# Patient Record
Sex: Female | Born: 1995 | Race: White | Hispanic: Yes | Marital: Single | Smoking: Never smoker
Health system: Southern US, Community
[De-identification: ages and names within clinical notes are randomized; demographics above are authoritative.]

## PROBLEM LIST (undated history)

## (undated) ENCOUNTER — Ambulatory Visit (HOSPITAL_COMMUNITY): Payer: Self-pay

## (undated) DIAGNOSIS — S82892A Other fracture of left lower leg, initial encounter for closed fracture: Secondary | ICD-10-CM

---
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
## 2017-10-19 ENCOUNTER — Encounter (HOSPITAL_COMMUNITY): Payer: Self-pay | Admitting: Emergency Medicine

## 2017-10-19 ENCOUNTER — Ambulatory Visit (HOSPITAL_COMMUNITY)
Admission: EM | Admit: 2017-10-19 | Discharge: 2017-10-19 | Disposition: A | Discharge status: Home / Self Care | Payer: Self-pay | Attending: Internal Medicine | Admitting: Internal Medicine

## 2017-10-19 ENCOUNTER — Other Ambulatory Visit: Payer: Self-pay

## 2017-10-19 DIAGNOSIS — Z8249 Family history of ischemic heart disease and other diseases of the circulatory system: Secondary | ICD-10-CM | POA: Insufficient documentation

## 2017-10-19 DIAGNOSIS — Z202 Contact with and (suspected) exposure to infections with a predominantly sexual mode of transmission: Secondary | ICD-10-CM

## 2017-10-19 DIAGNOSIS — Z791 Long term (current) use of non-steroidal anti-inflammatories (NSAID): Secondary | ICD-10-CM | POA: Insufficient documentation

## 2017-10-19 DIAGNOSIS — Z833 Family history of diabetes mellitus: Secondary | ICD-10-CM | POA: Insufficient documentation

## 2017-10-19 DIAGNOSIS — L989 Disorder of the skin and subcutaneous tissue, unspecified: Secondary | ICD-10-CM | POA: Insufficient documentation

## 2017-10-19 DIAGNOSIS — Z113 Encounter for screening for infections with a predominantly sexual mode of transmission: Secondary | ICD-10-CM

## 2017-10-19 DIAGNOSIS — Z9889 Other specified postprocedural states: Secondary | ICD-10-CM | POA: Insufficient documentation

## 2017-10-19 DIAGNOSIS — N9089 Other specified noninflammatory disorders of vulva and perineum: Secondary | ICD-10-CM

## 2017-10-19 DIAGNOSIS — Z801 Family history of malignant neoplasm of trachea, bronchus and lung: Secondary | ICD-10-CM | POA: Insufficient documentation

## 2017-10-19 DIAGNOSIS — N760 Acute vaginitis: Secondary | ICD-10-CM | POA: Insufficient documentation

## 2017-10-19 DIAGNOSIS — R300 Dysuria: Secondary | ICD-10-CM | POA: Insufficient documentation

## 2017-10-19 LAB — POCT URINALYSIS DIP (DEVICE)
GLUCOSE, UA: NEGATIVE mg/dL
KETONES UR: 40 mg/dL — AB
Nitrite: NEGATIVE
Protein, ur: 100 mg/dL — AB
Specific Gravity, Urine: 1.025 (ref 1.005–1.030)
Urobilinogen, UA: 0.2 mg/dL (ref 0.0–1.0)
pH: 6 (ref 5.0–8.0)

## 2017-10-19 LAB — POCT PREGNANCY, URINE: Preg Test, Ur: NEGATIVE

## 2017-10-19 MED ORDER — AZITHROMYCIN 250 MG PO TABS
ORAL_TABLET | ORAL | Status: AC
  Filled 2017-10-19: qty 4

## 2017-10-19 MED ORDER — METRONIDAZOLE 500 MG PO TABS: 500.0000 mg | ORAL_TABLET | Freq: Two times a day (BID) | ORAL | 0 refills | Status: AC

## 2017-10-19 MED ORDER — CEFTRIAXONE SODIUM 250 MG IJ SOLR
250.0000 mg | Freq: Once | INTRAMUSCULAR | Status: AC
  Administered 2017-10-19: 250 mg via INTRAMUSCULAR

## 2017-10-19 MED ORDER — VALACYCLOVIR HCL 1 G PO TABS: 1000.0000 mg | ORAL_TABLET | Freq: Two times a day (BID) | ORAL | 0 refills | Status: AC

## 2017-10-19 MED ORDER — AZITHROMYCIN 250 MG PO TABS
1000.0000 mg | ORAL_TABLET | Freq: Once | ORAL | Status: AC
  Administered 2017-10-19: 1000 mg via ORAL

## 2017-10-19 MED ORDER — CEFTRIAXONE SODIUM 250 MG IJ SOLR
INTRAMUSCULAR | Status: AC
  Filled 2017-10-19: qty 250

## 2017-10-19 NOTE — ED Provider Notes (Signed)
[facility]    CSN: [id number] Arrival date & time: 10/19/17  0957     History   Chief Complaint Chief Complaint  Patient presents with  . Urinary Tract Infection    HPI Laurie Underwood is a 22 y.o. female.   22 year old female presents with painful bumps/sores in the labia area for about 1-2 weeks. Causing pain when urine is passing over the sores. Also having unusual vaginal discharge with foul odor. May have had a slight fever the past 2 days and feels nauseous today. Denies any back or upper abdominal pain, vomiting or diarrhea. Has not taken any medications or tried any topical treatments for symptoms. Last period 2 days ago- was heavy but now has stopped. Is currently sexually active. Has had 6 partners in the past 3 months. Does not use condoms. No previous history of STD or cold sores. No family history of HSV. No other chronic health issues. Takes no daily medication.   The history is provided by the patient.    Past Medical History:  Diagnosis Date  . Ankle fracture, left 09/20/2015    There are no active problems to display for this patient.   Past Surgical History:  Procedure Laterality Date  . ORIF ANKLE FRACTURE Left 10/09/2015   Procedure: OPEN REDUCTION INTERNAL FIXATION (ORIF) LEFT ANKLE FRACTURE ;  Surgeon: Renette Butters, MD;  Location: [facility];  Service: Orthopedics;  Laterality: Left;    OB History   None      Home Medications    Prior to Admission medications   Medication Sig Start Date End Date Taking? Authorizing Provider  ibuprofen (ADVIL,MOTRIN) 600 MG tablet Take 1 tablet (600 mg total) by mouth every 6 (six) hours as needed. 11/15/16   Shary Decamp, PA-C  metroNIDAZOLE (FLAGYL) 500 MG tablet Take 1 tablet (500 mg total) by mouth 2 (two) times daily for 7 days. 10/19/17 10/26/17  Katy Apo, NP  valACYclovir (VALTREX) 1000 MG tablet Take 1 tablet (1,000 mg total) by mouth 2 (two) times daily for 10 days. 10/19/17  10/29/17  Katy Apo, NP    Family History Family History  Problem Relation Age of Onset  . Lung cancer Maternal Grandfather   . Hyperlipidemia Mother   . Diabetes Father   . Hypertension Maternal Grandmother     Social History Social History   Tobacco Use  . Smoking status: Never Smoker  . Smokeless tobacco: Never Used  Substance Use Topics  . Alcohol use: No  . Drug use: No     Allergies   Hydrocodone-acetaminophen and Soap   Review of Systems Review of Systems  Constitutional: Positive for appetite change, chills, fatigue and fever. Negative for activity change.  HENT: Negative for mouth sores, sore throat and trouble swallowing.   Respiratory: Negative for cough, chest tightness, shortness of breath and wheezing.   Gastrointestinal: Positive for abdominal pain and nausea. Negative for blood in stool, constipation, diarrhea and vomiting.  Genitourinary: Positive for decreased urine volume, dysuria, genital sores and vaginal discharge. Negative for difficulty urinating, flank pain, frequency, hematuria, pelvic pain and vaginal bleeding.  Musculoskeletal: Negative for arthralgias, back pain and myalgias.  Skin: Positive for color change and wound.  Allergic/Immunologic: Negative for immunocompromised state.  Neurological: Negative for dizziness, seizures, syncope, weakness, light-headedness and headaches.  Hematological: Negative for adenopathy. Does not bruise/bleed easily.  Psychiatric/Behavioral: Negative.      Physical Exam Triage Vital Signs ED Triage Vitals  Enc  Vitals Group     BP 10/19/17 1015 125/81     Pulse Rate 10/19/17 1015 83     Resp 10/19/17 1015 18     Temp 10/19/17 1015 98.1 F (36.7 C)     Temp Source 10/19/17 1015 Oral     SpO2 10/19/17 1015 100 %     Weight --      Height --      Head Circumference --      Peak Flow --      Pain Score 10/19/17 1016 9     Pain Loc --      Pain Edu? --      Excl. in [place]? --    No data  found.  Updated Vital Signs BP 125/81 (BP Location: Left Arm)   Pulse 83   Temp 98.1 F (36.7 C) (Oral)   Resp 18   LMP 10/14/2017   SpO2 100%   Visual Acuity Right Eye Distance:   Left Eye Distance:   Bilateral Distance:    Right Eye Near:   Left Eye Near:    Bilateral Near:     Physical Exam  Constitutional: She is oriented to person, place, and time. Vital signs are normal. She appears well-developed and well-nourished. No distress.  Patient is crying due to symptoms and pain.   HENT:  Head: Normocephalic and atraumatic.  Mouth/Throat: Uvula is midline, oropharynx is clear and moist and mucous membranes are normal.  Eyes: Conjunctivae and EOM are normal.  Neck: Normal range of motion.  Cardiovascular: Normal rate, regular rhythm and normal heart sounds.  No murmur heard. Pulmonary/Chest: Effort normal and breath sounds normal. No respiratory distress. She has no decreased breath sounds. She has no wheezes. She has no rhonchi. She has no rales.  Abdominal: Soft. Normal appearance and bowel sounds are normal. There is generalized tenderness. There is no rigidity, no rebound, no guarding and no CVA tenderness. Hernia confirmed negative in the right inguinal area and confirmed negative in the left inguinal area.  Genitourinary:    Pelvic exam was performed with patient in the knee-chest position. No labial fusion. There is tenderness and lesion on the right labia. There is no rash or injury on the right labia. There is tenderness and lesion on the left labia. There is no rash or injury on the left labia. There is tenderness in the vagina. No bleeding in the vagina. Vaginal discharge found.  Genitourinary Comments: Multiple red, raised lesions and ulcers present on mid to upper labia- more lesions on left than right. Over 15 lesions present. Inner labia and vaginal vault erythematous with more ulcers and slight yellowish, foul odor present. Very tender.  No internal exam performed  per patient request.   Musculoskeletal: Normal range of motion.  Lymphadenopathy: No inguinal adenopathy noted on the right or left side.  Neurological: She is alert and oriented to person, place, and time.  Skin: Skin is warm and dry. There is erythema.  Psychiatric: Her speech is normal and behavior is normal. Thought content normal. Her mood appears anxious.  Patient nervous and concerned about possible diagnosis.      UC Treatments / Results  Labs (all labs ordered are listed, but only abnormal results are displayed) Labs Reviewed  POCT URINALYSIS DIP (DEVICE) - Abnormal; Notable for the following components:      Result Value   Bilirubin Urine SMALL (*)    Ketones, ur 40 (*)    Hgb urine dipstick SMALL (*)  Protein, ur 100 (*)    Leukocytes, UA SMALL (*)    All other components within normal limits  HSV CULTURE AND TYPING  POCT PREGNANCY, URINE  URINE CYTOLOGY ANCILLARY ONLY    EKG None Radiology No results found.  Procedures Procedures (including critical care time)  Medications Ordered in UC Medications  azithromycin (ZITHROMAX) tablet 1,000 mg (1,000 mg Oral Given 10/19/17 1207)  cefTRIAXone (ROCEPHIN) injection 250 mg (250 mg Intramuscular Given 10/19/17 1207)     Initial Impression / Assessment and Plan / UC Course  I have reviewed the triage vital signs and the nursing notes.  Pertinent labs & imaging results that were available during my care of the patient were reviewed by me and considered in my medical decision making (see chart for details).    Patient unable to produce enough urine for culture. 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 Patient crying and had multiple questions regarding treatment and recurrence. Gave Rocephin 250mg  IM now and Zithromax 1g now for possible Chlamydia and Gonorrhea. Recommend start Valtrex 1g twice a day for 10 days. Start Flagyl 500mg  twice a day for 7 days for possible  BV. May use Vaseline on sores to help with comfort. May also take Ibuprofen 600 to 800mg  every 8 hours as needed for pain. No sexual intercourse for at least 7 days. Encouraged to use condoms with each and every future sexual encounter. Discussed course of HSV, recurrence and treatments. Recommend follow-up pending lab results.    Final Clinical Impressions(s) / UC Diagnoses   Final diagnoses:  Labial lesion  Acute vaginitis  Potential exposure to STD  Dysuria    ED Discharge Orders        Ordered    valACYclovir (VALTREX) 1000 MG tablet  2 times daily     10/19/17 1157    metroNIDAZOLE (FLAGYL) 500 MG tablet  2 times daily     10/19/17 1157       Controlled Substance Prescriptions [place] Controlled Substance Registry consulted? Not Applicable   Katy Apo, NP 10/19/17 2034

## 2017-10-19 NOTE — ED Triage Notes (Signed)
Complains of a 1-2 week history of bumps on labia.  Patient has pain with urination.

## 2017-10-19 NOTE — Discharge Instructions (Addendum)
You were given a shot of Rocephin (antibiotic) today and oral pills (Zithromax) to help with infection. Recommend start Valtrex 1 twice a day for 10 days. Start Flagyl 500mg  twice a day for 7 days for vaginal infection. May use Vaseline on sores to help with comfort. May take Ibuprofen 600 to 800mg  every 8 hours as needed for pain. No sexual intercourse for at least 7 days. Encouraged to use condoms with each and every future sexual encounter. Follow-up pending lab results.

## 2017-10-20 ENCOUNTER — Telehealth (HOSPITAL_COMMUNITY): Payer: Self-pay

## 2017-10-20 LAB — URINE CYTOLOGY ANCILLARY ONLY
Chlamydia: POSITIVE — AB
Neisseria Gonorrhea: NEGATIVE
Trichomonas: POSITIVE — AB

## 2017-10-20 NOTE — Telephone Encounter (Signed)
Chlamydia is positive.  This was treated at the urgent care visit with po zithromax 1g.  Attempted to contact pt. No answer at this time. Voicemail left.  Need to educate pt to please refrain from sexual intercourse for 7 days to give the medicine time to work.  Sexual partners need to be notified and tested/treated.  Condoms may reduce risk of reinfection.  Recheck or followup with PCP for further evaluation if symptoms are not improving.  [facility] notified. Also, Trichomonas is positive. Rx metronidazole was given at the urgent care visit. Attempted to contact pt. No answer at this time. Voicemail left.  Need to educate pt to please refrain from sexual intercourse for 7 days to give the medicine time to work. Sexual partners need to be notified and tested/treated. Condoms may reduce risk of reinfection. Recheck for further evaluation if symptoms are not improving. Answered all questions.

## 2017-10-21 LAB — URINE CYTOLOGY ANCILLARY ONLY: Candida vaginitis: NEGATIVE

## 2017-10-21 LAB — HSV CULTURE AND TYPING

## 2017-10-22 ENCOUNTER — Telehealth (HOSPITAL_COMMUNITY): Payer: Self-pay

## 2017-10-22 NOTE — Telephone Encounter (Signed)
Pt returned call regarding results. All education given to patient regarding results from previous note.  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 Educated on Herpes and safe sex practices. Pt denies any concerns and verbalized understanding. Also Bacterial vaginosis is positive. This was not treated at the urgent care visit.  Patient complains of persistent symptoms.  Flagyl 500 mg BID x 7 days #14 no refills sent to patients pharmacy of choice per Dr. Valere Dross.  Pt called and made aware of results and new prescription. Answered all questions and pt verbalized understanding. Pt concerned about price of medications, pt given information for GoodRX coupon.

## 2020-04-27 ENCOUNTER — Other Ambulatory Visit: Payer: Self-pay

## 2020-04-27 ENCOUNTER — Encounter (HOSPITAL_COMMUNITY): Payer: Self-pay | Admitting: Emergency Medicine

## 2020-04-27 ENCOUNTER — Emergency Department (HOSPITAL_COMMUNITY)
Admission: EM | Admit: 2020-04-27 | Discharge: 2020-04-27 | Disposition: A | Discharge status: Home / Self Care | Payer: Self-pay | Attending: Emergency Medicine | Admitting: Emergency Medicine

## 2020-04-27 DIAGNOSIS — L249 Irritant contact dermatitis, unspecified cause: Secondary | ICD-10-CM | POA: Insufficient documentation

## 2020-04-27 NOTE — ED Triage Notes (Signed)
Pt reports rash to left hand a month. States she popped a spot on her hand and now states random spots have popped up her arms. Also endorses a mouth sore.

## 2020-04-27 NOTE — Discharge Instructions (Signed)
You can try taking zyrtec if there is itching.  Also use a really good lotion like Aveeno or Eucerin.  Syphilis results will come back in 1 or 2 days

## 2020-04-27 NOTE — ED Provider Notes (Signed)
[facility] EMERGENCY DEPARTMENT Provider Note   CSN: [id number] Arrival date & time: 04/27/20  1542     History Chief Complaint  Patient presents with  . Rash    Laurie Underwood is a 24 y.o. female.  The history is provided by the patient.  Rash Location: left hand and then intermittently on the arms. Quality comment:  Small red bumps that sometimes itch Severity:  Mild Onset quality:  Gradual Duration:  1 month Timing:  Intermittent Progression:  Partially resolved Chronicity:  New Context comment:  Unknown Relieved by:  None tried Worsened by:  Nothing Ineffective treatments:  None tried Associated symptoms: no joint pain and no myalgias   Associated symptoms comment:  3 days ago also got a sore on the inner lower lip      Past Medical History:  Diagnosis Date  . Ankle fracture, left 09/20/2015    There are no problems to display for this patient.   Past Surgical History:  Procedure Laterality Date  . ORIF ANKLE FRACTURE Left 10/09/2015   Procedure: OPEN REDUCTION INTERNAL FIXATION (ORIF) LEFT ANKLE FRACTURE ;  Surgeon: Renette Butters, MD;  Location: [facility];  Service: Orthopedics;  Laterality: Left;     OB History   No obstetric history on file.     Family History  Problem Relation Age of Onset  . Lung cancer Maternal Grandfather   . Hyperlipidemia Mother   . Diabetes Father   . Hypertension Maternal Grandmother     Social History   Tobacco Use  . Smoking status: Never Smoker  . Smokeless tobacco: Never Used  Substance Use Topics  . Alcohol use: No  . Drug use: No    Home Medications Prior to Admission medications   Medication Sig Start Date End Date Taking? Authorizing Provider  ibuprofen (ADVIL,MOTRIN) 600 MG tablet Take 1 tablet (600 mg total) by mouth every 6 (six) hours as needed. 11/15/16   Shary Decamp, PA-C    Allergies    Hydrocodone-acetaminophen and Soap  Review of Systems   Review of  Systems  Musculoskeletal: Negative for arthralgias and myalgias.  Skin: Positive for rash.  All other systems reviewed and are negative.   Physical Exam Updated Vital Signs BP 102/73   Pulse 71   Temp 98.6 F (37 C) (Oral)   Resp 16   Ht 5\' 3"  (1.6 m)   Wt 59.9 kg   SpO2 100%   BMI 23.38 kg/m   Physical Exam Vitals and nursing note reviewed.  Constitutional:      General: She is not in acute distress.    Appearance: Normal appearance. She is normal weight.  HENT:     Head: Normocephalic.     Nose: Nose normal.     Mouth/Throat:     Mouth: Mucous membranes are moist.     Comments: Ulcerative circular lesion present on the inner lower lip Cardiovascular:     Rate and Rhythm: Normal rate.     Pulses: Normal pulses.  Pulmonary:     Effort: Pulmonary effort is normal.  Musculoskeletal:        General: No swelling.  Skin:    General: Skin is warm.     Comments: No notable rash at this time.  Dry skin to the hands without flaking  Neurological:     Mental Status: She is alert. Mental status is at baseline.  Psychiatric:        Mood and Affect: Mood normal.  Behavior: Behavior normal.        Thought Content: Thought content normal.     ED Results / Procedures / Treatments   Labs (all labs ordered are listed, but only abnormal results are displayed) Labs Reviewed  RPR    EKG None  Radiology No results found.  Procedures Procedures (including critical care time)  Medications Ordered in ED Medications - No data to display  ED Course  I have reviewed the triage vital signs and the nursing notes.  Pertinent labs & imaging results that were available during my care of the patient were reviewed by me and considered in my medical decision making (see chart for details).    MDM Rules/Calculators/A&P                          Patient presenting reporting that she has had an intermittent rash for the last 1 month.  Started on her hand but has also been  on her arms intermittently.  She did speak with the doctor on a televisit who told her she may have molluscum.  However on my exam I cannot appreciate any specific rash.  She does not have molluscum.  She does have dry skin.  Feel that the rash patient is discussing is most likely from dry skin and localized irritant.  She has no rash on her palms or soles concerning for syphilis however patient wants to be tested because she is concerned.  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  She does have an aphthous ulcer in her mouth but no evidence of lesions around the mouth.  Patient was tested for HIV last week and was negative.  Encouraged her to use a thick moisturizing lotion and Zyrtec as needed if she is getting more itchiness.  No evidence of allergic reaction at this time.  Final Clinical Impression(s) / ED Diagnoses Final diagnoses:  Irritant contact dermatitis, unspecified trigger    Rx / DC Orders ED Discharge Orders    None       Blanchie Dessert, MD 04/27/20 1827

## 2020-04-27 NOTE — ED Notes (Signed)
Patient Alert and oriented to baseline. Stable and ambulatory to baseline. Patient verbalized understanding of the discharge instructions.  Patient belongings were taken by the patient.   

## 2020-04-28 LAB — RPR: RPR Ser Ql: NONREACTIVE

## 2020-05-03 ENCOUNTER — Other Ambulatory Visit: Payer: Self-pay

## 2020-05-03 ENCOUNTER — Encounter (HOSPITAL_COMMUNITY): Payer: Self-pay

## 2020-05-03 ENCOUNTER — Ambulatory Visit (HOSPITAL_COMMUNITY)
Admission: EM | Admit: 2020-05-03 | Discharge: 2020-05-03 | Disposition: A | Discharge status: Home / Self Care | Payer: Self-pay

## 2020-05-03 DIAGNOSIS — R21 Rash and other nonspecific skin eruption: Secondary | ICD-10-CM

## 2020-05-03 DIAGNOSIS — Z3202 Encounter for pregnancy test, result negative: Secondary | ICD-10-CM

## 2020-05-03 DIAGNOSIS — R109 Unspecified abdominal pain: Secondary | ICD-10-CM

## 2020-05-03 LAB — POCT URINALYSIS DIPSTICK, ED / UC
Bilirubin Urine: NEGATIVE
Glucose, UA: NEGATIVE mg/dL
Hgb urine dipstick: NEGATIVE
Ketones, ur: NEGATIVE mg/dL
Leukocytes,Ua: NEGATIVE
Nitrite: NEGATIVE
Protein, ur: NEGATIVE mg/dL
Specific Gravity, Urine: 1.01 (ref 1.005–1.030)
Urobilinogen, UA: 0.2 mg/dL (ref 0.0–1.0)
pH: 5.5 (ref 5.0–8.0)

## 2020-05-03 LAB — POC URINE PREG, ED: Preg Test, Ur: NEGATIVE

## 2020-05-03 NOTE — ED Triage Notes (Addendum)
Pt is here after feeling weak with back and abdominal pain this started this morning. Pt also has a rash that started 5 weeks ago & was seen for this & was told it was Dermatitis. 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 Pt is requesting a specific medicine and further testing like a biopsy  for her rash.

## 2020-05-03 NOTE — Discharge Instructions (Signed)
Follow up with dermatology as planned Your urine was normal Drink plenty of water.

## 2020-05-04 NOTE — ED Provider Notes (Signed)
[facility]    CSN: [id number] Arrival date & time: 05/03/20  1155      History   Chief Complaint Chief Complaint  Patient presents with  . Abdominal Pain  . Rash    HPI Kayle Passarelli is a 24 y.o. female.   Patient is a 24 year old female presents today for rash.  This is been ongoing issue off and on for the past 5 weeks.  Has been seen for this and diagnosed with dermatitis.  The rash is itchy.  Rash is scattered.  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  Here today requesting biopsy of rash and specific medication to treat.  Reports she has been online with a chat about HSV and believes this rash is related to that. She has been applying tape to the rash.   Denies any fever, joint pain. Denies any recent changes in lotions, detergents, foods or other possible irritants. No recent travel. Nobody else at home has the rash. Patient has been outside but denies any contact with plants or insects. No new foods or medications.   She is also having some lower back pain, generalized lower abdominal pain.  No nausea, vomiting or diarrhea.  No specific urinary symptoms or vaginal symptoms.     Past Medical History:  Diagnosis Date  . Ankle fracture, left 09/20/2015    There are no problems to display for this patient.   Past Surgical History:  Procedure Laterality Date  . ORIF ANKLE FRACTURE Left 10/09/2015   Procedure: OPEN REDUCTION INTERNAL FIXATION (ORIF) LEFT ANKLE FRACTURE ;  Surgeon: Renette Butters, MD;  Location: [facility];  Service: Orthopedics;  Laterality: Left;    OB History   No obstetric history on file.      Home Medications    Prior to Admission medications   Medication Sig Start Date End Date Taking? Authorizing Provider  valGANciclovir (VALCYTE) 450 MG tablet Take by mouth daily.   Yes [provider]  ibuprofen (ADVIL,MOTRIN) 600 MG tablet Take 1 tablet (600 mg total) by mouth every 6  (six) hours as needed. 11/15/16   Shary Decamp, PA-C    Family History Family History  Problem Relation Age of Onset  . Lung cancer Maternal Grandfather   . Hyperlipidemia Mother   . Diabetes Father   . Hypertension Maternal Grandmother     Social History Social History   Tobacco Use  . Smoking status: Never Smoker  . Smokeless tobacco: Never Used  Substance Use Topics  . Alcohol use: No  . Drug use: No     Allergies   Hydrocodone-acetaminophen and Soap   Review of Systems Review of Systems   Physical Exam Triage Vital Signs ED Triage Vitals  Enc Vitals Group     BP 05/03/20 1326 98/62     Pulse Rate 05/03/20 1326 73     Resp 05/03/20 1326 16     Temp 05/03/20 1326 97.8 F (36.6 C)     Temp Source 05/03/20 1326 Oral     SpO2 05/03/20 1326 100 %     Weight --      Height --      Head Circumference --      Peak Flow --      Pain Score 05/03/20 1322 0     Pain Loc --      Pain Edu? --      Excl. in [place]? --  No data found.  Updated Vital Signs BP 98/62 (BP Location: Right Arm)   Pulse 73   Temp 97.8 F (36.6 C) (Oral)   Resp 16   LMP 04/23/2020   SpO2 100%   Visual Acuity Right Eye Distance:   Left Eye Distance:   Bilateral Distance:    Right Eye Near:   Left Eye Near:    Bilateral Near:     Physical Exam Vitals and nursing note reviewed.  Constitutional:      General: She is not in acute distress.    Appearance: Normal appearance. She is not ill-appearing, toxic-appearing or diaphoretic.  HENT:     Head: Normocephalic.     Nose: Nose normal.     Mouth/Throat:     Pharynx: Oropharynx is clear.  Eyes:     Conjunctiva/sclera: Conjunctivae normal.  Pulmonary:     Effort: Pulmonary effort is normal.  Abdominal:     Palpations: Abdomen is soft.     Tenderness: There is no abdominal tenderness.  Musculoskeletal:        General: Normal range of motion.     Cervical back: Normal range of motion.  Skin:    General: Skin is warm and dry.       Findings: No rash.     Comments: No visible rash   Neurological:     Mental Status: She is alert.  Psychiatric:        Mood and Affect: Mood normal.      UC Treatments / Results  Labs (all labs ordered are listed, but only abnormal results are displayed) Labs Reviewed  POC URINE PREG, ED  POCT URINALYSIS DIPSTICK, ED / UC    EKG   Radiology No results found.  Procedures Procedures (including critical care time)  Medications Ordered in UC Medications - No data to display  Initial Impression / Assessment and Plan / UC Course  I have reviewed the triage vital signs and the nursing notes.  Pertinent labs & imaging results that were available during my care of the patient were reviewed by me and considered in my medical decision making (see chart for details).     Rash No visible rash on exam Patient requesting specific medication that she googled online to treat this rash. Recommended that is not best practice.   She has an appointment next week to see a dermatologist.  Recommend she follow-up with them.  Abdominal pain No specific pain on exam. Urine without infection or pregnancy Follow up as needed for continued or worsening symptoms  Final Clinical Impressions(s) / UC Diagnoses   Final diagnoses:  Rash  Abdominal pain, unspecified abdominal location     Discharge Instructions     Follow up with dermatology as planned Your urine was normal Drink plenty of water.      ED Prescriptions    None     PDMP not reviewed this encounter.   Orvan July, NP 05/04/20 [phone]

## 2021-08-14 ENCOUNTER — Encounter (HOSPITAL_COMMUNITY): Payer: Self-pay | Admitting: *Deleted

## 2021-08-14 ENCOUNTER — Emergency Department (HOSPITAL_COMMUNITY)
Admission: EM | Admit: 2021-08-14 | Discharge: 2021-08-15 | Disposition: A | Discharge status: Home / Self Care | Payer: BC Managed Care – PPO | Attending: Emergency Medicine | Admitting: Emergency Medicine

## 2021-08-14 ENCOUNTER — Inpatient Hospital Stay (HOSPITAL_COMMUNITY): Admission: RE | Admit: 2021-08-14 | Payer: Self-pay | Source: Ambulatory Visit

## 2021-08-14 ENCOUNTER — Other Ambulatory Visit: Payer: Self-pay

## 2021-08-14 DIAGNOSIS — R21 Rash and other nonspecific skin eruption: Secondary | ICD-10-CM | POA: Diagnosis present

## 2021-08-14 DIAGNOSIS — Z5321 Procedure and treatment not carried out due to patient leaving prior to being seen by health care provider: Secondary | ICD-10-CM | POA: Insufficient documentation

## 2021-08-14 DIAGNOSIS — N899 Noninflammatory disorder of vagina, unspecified: Secondary | ICD-10-CM | POA: Diagnosis not present

## 2021-08-14 DIAGNOSIS — K120 Recurrent oral aphthae: Secondary | ICD-10-CM | POA: Diagnosis not present

## 2021-08-14 NOTE — ED Triage Notes (Addendum)
Pt states she has had a rash "on and off for a half of a year" on her lower legs. Also has two sores in her mouth she would like evaluated. White vaginal discharge for about 2 weeks.

## 2021-08-14 NOTE — ED Provider Triage Note (Signed)
Emergency Medicine Provider Triage Evaluation Note  Laurie Underwood , a 26 y.o. female  was evaluated in triage.  Pt complains of rash to bilateral legs, vaginal discharge and unprotected sex on December 26.  Patient is here requesting STI testing.  She also states she has a vaginal discharge, is requesting BV and trichomonas testing.  Patient states that she has a history of HSV-2, recently had sex with partner unprotected he also has unknown STI.[name]  Review of Systems  Positive: Vaginal discharge, abdominal pain Negative: Nausea, vomiting, fevers, diarrhea  Physical Exam  BP 135/86 (BP Location: Right Arm)    Pulse 68    Temp 98.7 F (37.1 C) (Oral)    Resp 19    LMP 07/26/2021    SpO2 100%  Gen:   Awake, no distress   Resp:  Normal effort  MSK:   Moves extremities without difficulty  Other:    Medical Decision Making  Medically screening exam initiated at 7:57 PM.  Appropriate orders placed.  Sheretha Shadd was informed that the remainder of the evaluation will be completed by another provider, this initial triage assessment does not replace that evaluation, and the importance of remaining in the ED until their evaluation is complete.     Azucena Cecil, PA-C 08/14/21 1958

## 2021-08-15 LAB — GC/CHLAMYDIA PROBE AMP (~~LOC~~) NOT AT ARMC
Chlamydia: NEGATIVE
Comment: NEGATIVE
Comment: NORMAL
Neisseria Gonorrhea: NEGATIVE

## 2021-08-15 NOTE — ED Notes (Signed)
Pt didn't answered when called for vitals

## 2021-10-07 ENCOUNTER — Emergency Department (HOSPITAL_COMMUNITY)
Admission: EM | Admit: 2021-10-07 | Discharge: 2021-10-08 | Discharge status: Against Medical Advice | Payer: 59 | Attending: Student | Admitting: Student

## 2021-10-07 ENCOUNTER — Encounter (HOSPITAL_COMMUNITY): Payer: Self-pay

## 2021-10-07 ENCOUNTER — Emergency Department (HOSPITAL_COMMUNITY): Payer: 59

## 2021-10-07 ENCOUNTER — Other Ambulatory Visit: Payer: Self-pay

## 2021-10-07 DIAGNOSIS — N83201 Unspecified ovarian cyst, right side: Secondary | ICD-10-CM | POA: Insufficient documentation

## 2021-10-07 DIAGNOSIS — N939 Abnormal uterine and vaginal bleeding, unspecified: Secondary | ICD-10-CM | POA: Insufficient documentation

## 2021-10-07 DIAGNOSIS — D259 Leiomyoma of uterus, unspecified: Secondary | ICD-10-CM | POA: Diagnosis not present

## 2021-10-07 DIAGNOSIS — Z5321 Procedure and treatment not carried out due to patient leaving prior to being seen by health care provider: Secondary | ICD-10-CM | POA: Insufficient documentation

## 2021-10-07 LAB — COMPREHENSIVE METABOLIC PANEL
ALT: 10 U/L (ref 0–44)
AST: 12 U/L — ABNORMAL LOW (ref 15–41)
Albumin: 3.7 g/dL (ref 3.5–5.0)
Alkaline Phosphatase: 47 U/L (ref 38–126)
Anion gap: 7 (ref 5–15)
BUN: 10 mg/dL (ref 6–20)
CO2: 23 mmol/L (ref 22–32)
Calcium: 8.9 mg/dL (ref 8.9–10.3)
Chloride: 107 mmol/L (ref 98–111)
Creatinine, Ser: 0.52 mg/dL (ref 0.44–1.00)
GFR, Estimated: >60 mL/min (ref >60)
Glucose, Bld: 85 mg/dL (ref 70–99)
Potassium: 4.2 mmol/L (ref 3.5–5.1)
Sodium: 137 mmol/L (ref 135–145)
Total Bilirubin: 0.4 mg/dL (ref 0.3–1.2)
Total Protein: 6.7 g/dL (ref 6.5–8.1)

## 2021-10-07 LAB — ABO/RH: ABO/RH(D): A POS

## 2021-10-07 LAB — CBC
HCT: 36.5 % (ref 36.0–46.0)
Hemoglobin: 11.4 g/dL — ABNORMAL LOW (ref 12.0–15.0)
MCH: 28.4 pg (ref 26.0–34.0)
MCHC: 31.2 g/dL (ref 30.0–36.0)
MCV: 91 fL (ref 80.0–100.0)
Platelets: 379 10*3/uL (ref 150–400)
RBC: 4.01 MIL/uL (ref 3.87–5.11)
RDW: 12.1 % (ref 11.5–15.5)
WBC: 10.4 10*3/uL (ref 4.0–10.5)
nRBC: 0 % (ref 0.0–0.2)

## 2021-10-07 LAB — I-STAT BETA HCG BLOOD, ED (MC, WL, AP ONLY): I-stat hCG, quantitative: <5 m[IU]/mL (ref <5)

## 2021-10-07 LAB — TYPE AND SCREEN
ABO/RH(D): A POS
Antibody Screen: NEGATIVE

## 2021-10-07 NOTE — ED Triage Notes (Signed)
Pt reports vaginal bleeding since Saturday. Pt reports she started having small amount of clots.  ?

## 2021-10-07 NOTE — ED Provider Triage Note (Signed)
Emergency Medicine Provider Triage Evaluation Note ? ?Laurie Underwood , a 26 y.o. female  was evaluated in triage.  Pt complains of vaginal bleeding for the past three days. The patient reports she is unsure if it is from her shaving of if it is from her vagina. LMP was 09/22/21. Denies any abdominal pain, nausea, or vomiting. She reports fatigue. She is using 3 super pads daily, not fully saturated. . ? ?Review of Systems  ?Positive: See above ?Negative: See above ? ?Physical Exam  ?BP 121/80 (BP Location: Right Arm)   Pulse 80   Temp 98.3 ?F (36.8 ?C) (Oral)   Resp 18   SpO2 97%  ?Gen:   Awake, no distress   ?Resp:  Normal effort  ?MSK:   Moves extremities without difficulty  ?Other:   ? ?Medical Decision Making  ?Medically screening exam initiated at 9:26 PM.  Appropriate orders placed.  Laurie Underwood was informed that the remainder of the evaluation will be completed by another provider, this initial triage assessment does not replace that evaluation, and the importance of remaining in the ED until their evaluation is complete. ? ?Will order labs and ultrasound ?  ?Sherrell Puller, PA-C ?10/07/21 2129 ? ?

## 2021-10-08 NOTE — ED Notes (Signed)
Patient left on own accord °

## 2022-08-02 IMAGING — US US PELVIS COMPLETE
1 series · 13 of 25 positions shown · non-contrast
Comparison: None

CLINICAL DATA: Vaginal bleeding

EXAM:
TRANSABDOMINAL AND TRANSVAGINAL ULTRASOUND OF PELVIS
TECHNIQUE: Both transabdominal and transvaginal ultrasound examinations of the
pelvis were performed. Transabdominal technique was performed for
global imaging of the pelvis including uterus, ovaries, adnexal
regions, and pelvic cul-de-sac. It was necessary to proceed with
endovaginal exam following the transabdominal exam to visualize the
ovaries and endometrium.

[Series 1: us pelvis (transabdominal only) · 13 of 141 slices shown]
[im 1/141]
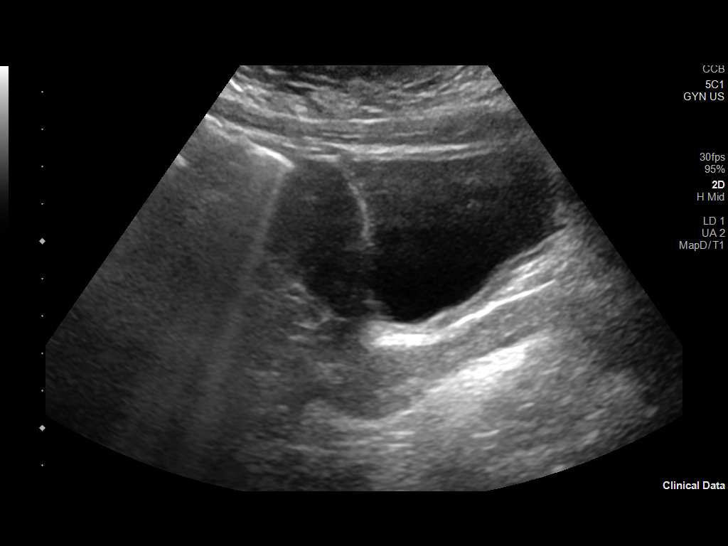
[im 12/141]
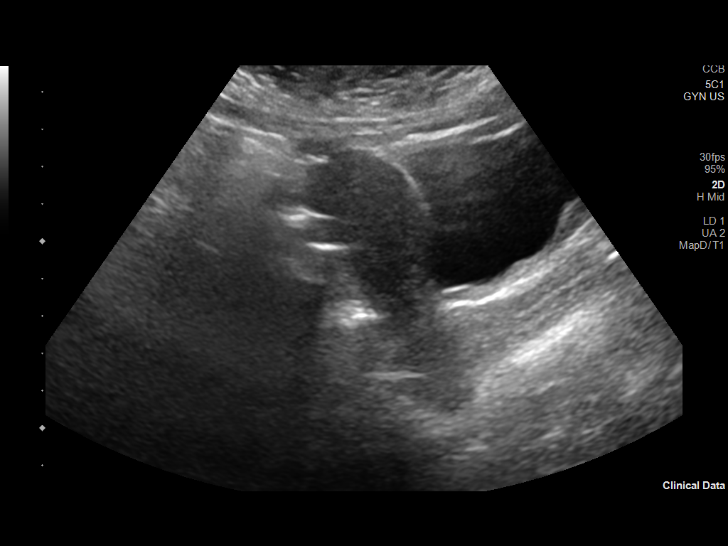
[im 24/141]
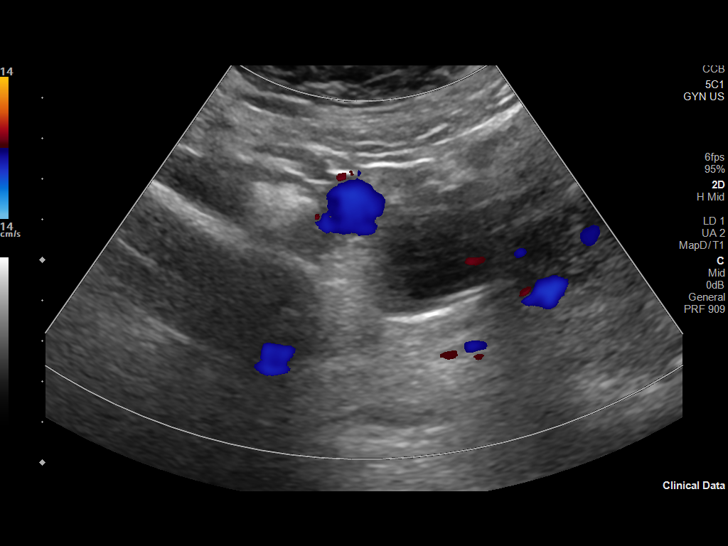
[im 36/141]
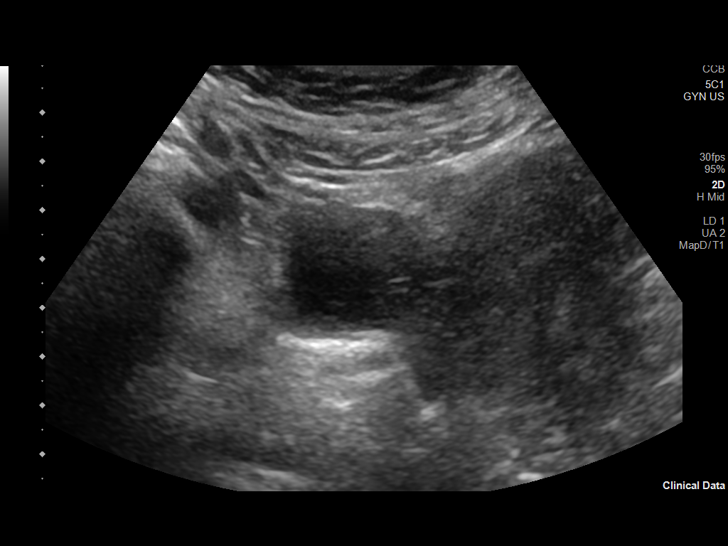
[im 47/141]
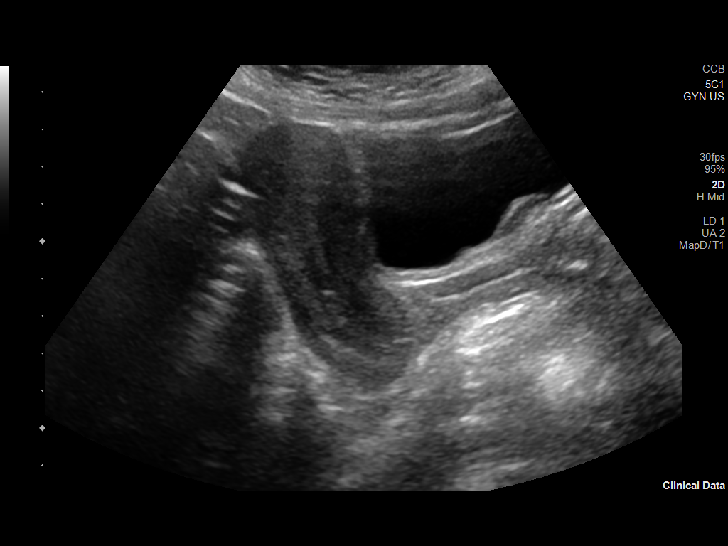
[im 59/141]
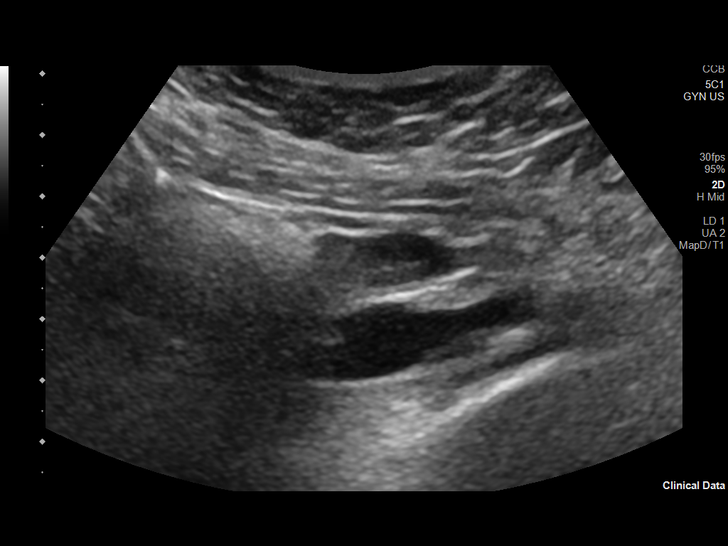
[im 71/141]
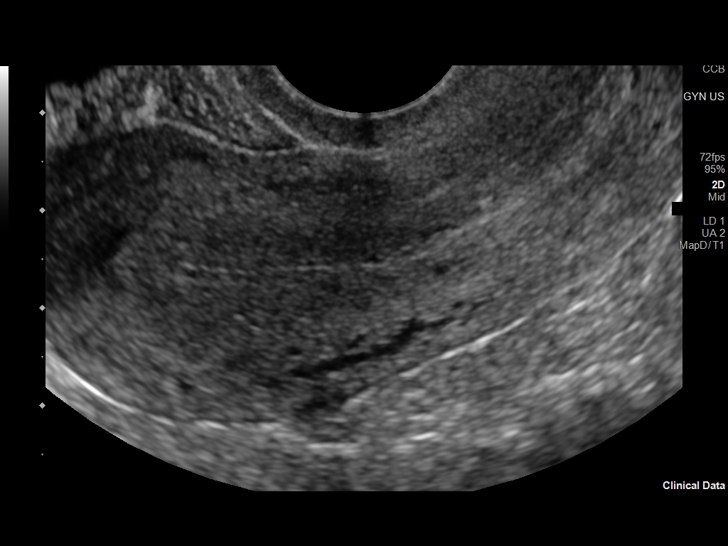
[im 82/141]
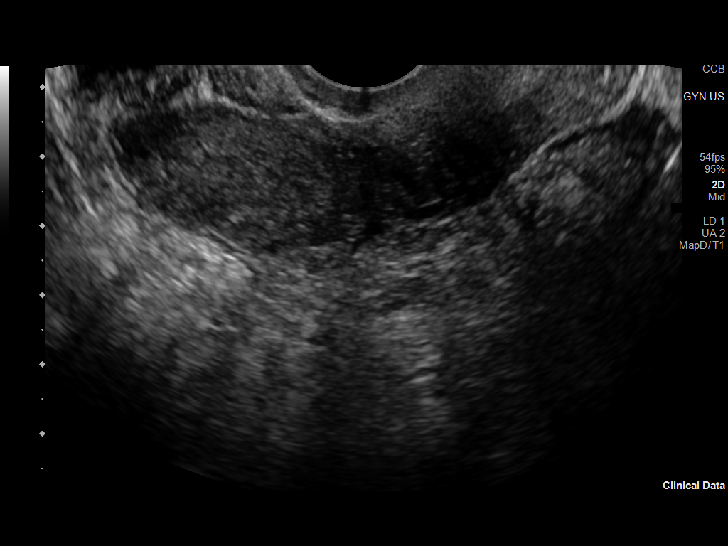
[im 94/141]
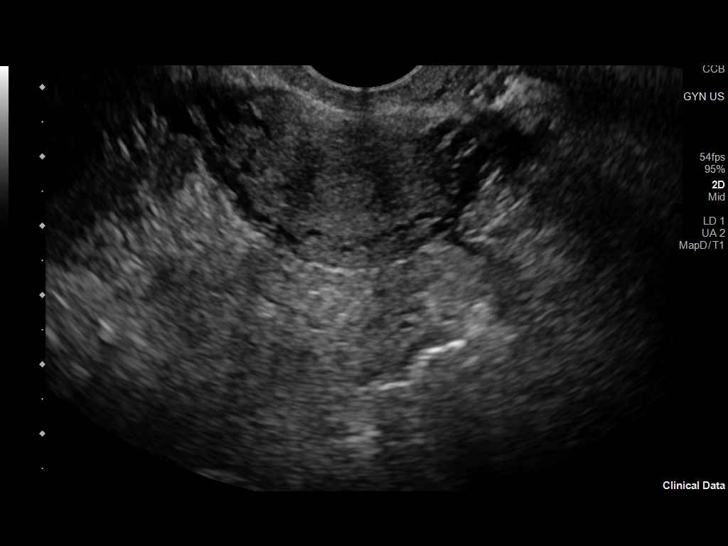
[im 106/141]
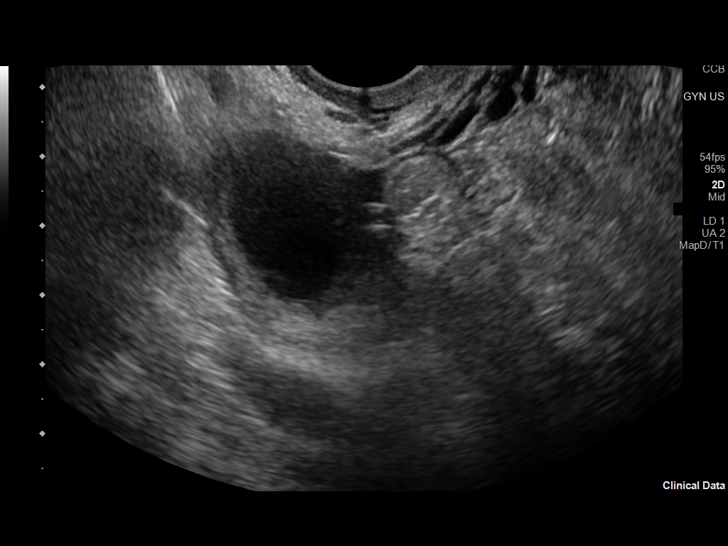
[im 117/141]
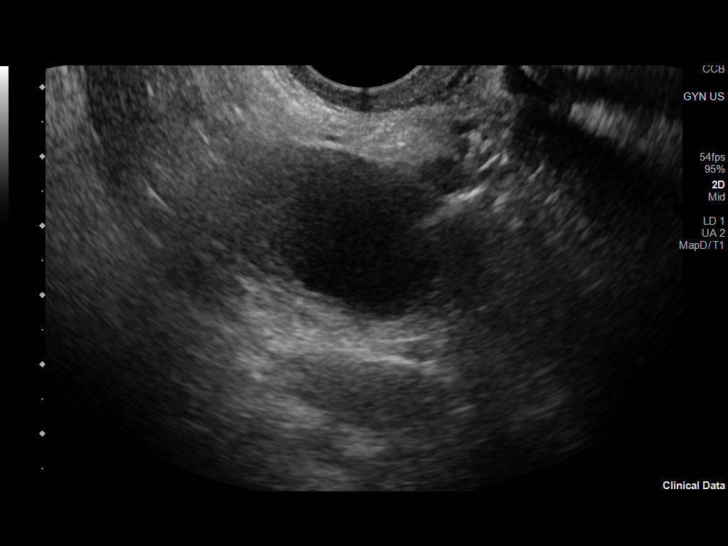
[im 129/141]
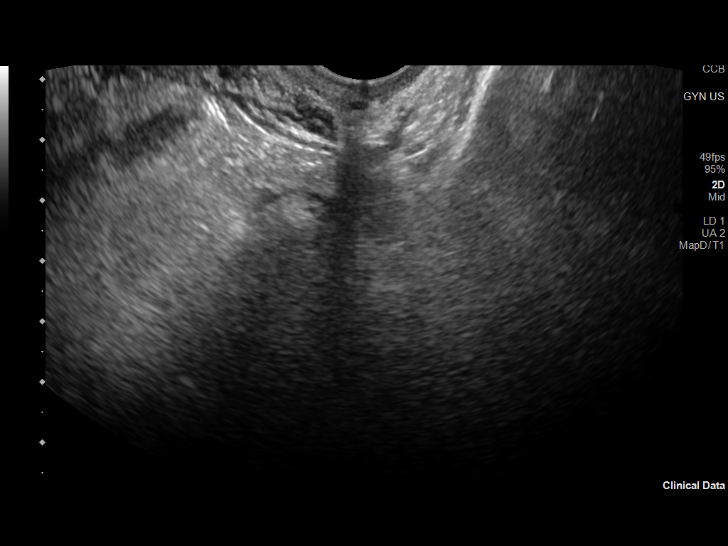
[im 141/141]
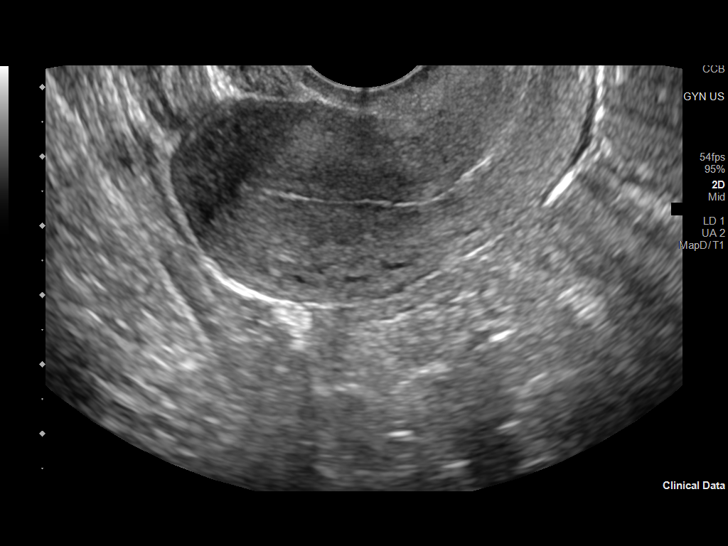

[13 of 25 positions shown; findings below may reference images not displayed]

FINDINGS: Uterus

Measurements: 7.6 x 3 x 4.9 cm = volume: 59 mL. Uterus is
anteverted. A small exophytic hypoechoic lesion is demonstrated off
of the posterior wall of the uterus measuring 8 mm maximal diameter.
This is likely a small fibroid.

Endometrium

Thickness: 7 mm, normal.  No focal abnormality visualized.

Right ovary

Measurements: 4.6 x 2.1 x 3.1 cm = volume: 16 mL. Cystic lesion
measuring 2.8 cm maximal diameter, likely physiologic. No abnormal
adnexal masses.

Left ovary

Measurements: 3 x 1.6 x 1.5 cm = volume: 4 mL. Normal appearance/no
adnexal mass.

Other findings

No abnormal free fluid.
IMPRESSION: 1. Small uterine fibroid.  No endometrial thickening.
2. Normal ultrasound appearance of the ovaries. 2.8 cm right ovarian
cyst is likely physiologic. No followup imaging recommended. Note:
This recommendation does not apply to premenarchal patients or to
those with increased risk (genetic, family history, elevated tumor
markers or other high-risk factors) of ovarian cancer. Reference:
Radiology [DATE]):[id number].

## 2023-04-07 ENCOUNTER — Ambulatory Visit (HOSPITAL_COMMUNITY)
Admission: EM | Admit: 2023-04-07 | Discharge: 2023-04-07 | Disposition: A | Discharge status: Home / Self Care | Payer: 59 | Attending: Emergency Medicine | Admitting: Emergency Medicine

## 2023-04-07 ENCOUNTER — Encounter (HOSPITAL_COMMUNITY): Payer: Self-pay

## 2023-04-07 DIAGNOSIS — R238 Other skin changes: Secondary | ICD-10-CM

## 2023-04-07 MED ORDER — TRIAMCINOLONE ACETONIDE 0.1 % EX CREA: 1.0000 | TOPICAL_CREAM | Freq: Two times a day (BID) | CUTANEOUS | 0 refills | Status: AC

## 2023-04-07 NOTE — ED Provider Notes (Signed)
[facility]    CSN: [id number] Arrival date & time: 04/07/23  1328      History   Chief Complaint Chief Complaint  Patient presents with   Rash    HPI Laurie Underwood is a 27 y.o. female. blister rash to R wrist x1 week. Pt shows me pictures that show small cluster papules that grow into vesicles. Pt is worried it is herpes simplex so she started taking valtrex as soon as rash appeared but it has not resolved rash. Denies itching or pain.    Rash   Past Medical History:  Diagnosis Date   Ankle fracture, left 09/20/2015    There are no problems to display for this patient.   Past Surgical History:  Procedure Laterality Date   ORIF ANKLE FRACTURE Left 10/09/2015   Procedure: OPEN REDUCTION INTERNAL FIXATION (ORIF) LEFT ANKLE FRACTURE ;  Surgeon: Sheral Apley, MD;  Location: [facility];  Service: Orthopedics;  Laterality: Left;    OB History   No obstetric history on file.      Home Medications    Prior to Admission medications   Medication Sig Start Date End Date Taking? Authorizing Provider  triamcinolone cream (KENALOG) 0.1 % Apply 1 Application topically 2 (two) times daily. 04/07/23  Yes Cathlyn Parsons, NP  valGANciclovir (VALCYTE) 450 MG tablet Take by mouth daily.   Yes [provider]    Family History Family History  Problem Relation Age of Onset   Lung cancer Maternal Grandfather    Hyperlipidemia Mother    Diabetes Father    Hypertension Maternal Grandmother     Social History Social History   Tobacco Use   Smoking status: Never   Smokeless tobacco: Never  Substance Use Topics   Alcohol use: Yes   Drug use: No     Allergies   Hydrocodone-acetaminophen and Soap   Review of Systems Review of Systems  Skin:  Positive for rash.     Physical Exam Triage Vital Signs ED Triage Vitals [04/07/23 1351]  Encounter Vitals Group     BP 102/70     Systolic BP Percentile      Diastolic BP Percentile       Pulse Rate 77     Resp 16     Temp 98.1 F (36.7 C)     Temp Source Oral     SpO2 98 %     Weight 172 lb (78 kg)     Height 5\' 3"  (1.6 m)     Head Circumference      Peak Flow      Pain Score 0     Pain Loc      Pain Education      Exclude from Growth Chart    No data found.  Updated Vital Signs BP 102/70 (BP Location: Left Arm)   Pulse 77   Temp 98.1 F (36.7 C) (Oral)   Resp 16   Ht 5\' 3"  (1.6 m)   Wt 172 lb (78 kg)   LMP 03/17/2023 (Approximate)   SpO2 98%   BMI 30.47 kg/m   Visual Acuity Right Eye Distance:   Left Eye Distance:   Bilateral Distance:    Right Eye Near:   Left Eye Near:    Bilateral Near:     Physical Exam Constitutional:      Appearance: Normal appearance.  Pulmonary:     Effort: Pulmonary effort is normal.  Musculoskeletal:  Arms:       Hands:  Neurological:     Mental Status: She is alert.      UC Treatments / Results  Labs (all labs ordered are listed, but only abnormal results are displayed) Labs Reviewed  HSV CULTURE AND TYPING    EKG   Radiology No results found.  Procedures Procedures (including critical care time)  Medications Ordered in UC Medications - No data to display  Initial Impression / Assessment and Plan / UC Course  I have reviewed the triage vital signs and the nursing notes.  Pertinent labs & imaging results that were available during my care of the patient were reviewed by me and considered in my medical decision making (see chart for details).    Testing for herpes as pt requested. As valtrex did not improve sx, suspect contact derm to unknown exposure. Will try steroid cream.   Final Clinical Impressions(s) / UC Diagnoses   Final diagnoses:  Vesicular rash     Discharge Instructions      Use the prescription cream on your rash twice a day. You will get a call if test is positive, you will not get a call if test is negative but you can check results in MyChart if you have a  MyChart account.   If the rash does not heal, I suggest following up with dermatology   ED Prescriptions     Medication Sig Dispense Auth. Provider   triamcinolone cream (KENALOG) 0.1 % Apply 1 Application topically 2 (two) times daily. 30 g Cathlyn Parsons, NP      PDMP not reviewed this encounter.   Cathlyn Parsons, NP 04/07/23 1455

## 2023-04-07 NOTE — Discharge Instructions (Signed)
Use the prescription cream on your rash twice a day. You will get a call if test is positive, you will not get a call if test is negative but you can check results in MyChart if you have a MyChart account.   If the rash does not heal, I suggest following up with dermatology

## 2023-04-07 NOTE — ED Triage Notes (Signed)
Patient here today with c/o small bumps on right wrist X 1 weeks. She has a h/o hsv and had an outbreak 1 week ago which she took Valacyclovir which helped with her symptoms with that but not the bumps on her wrist. Patient would like to have these bumps tested.

## 2023-04-09 LAB — HSV CULTURE AND TYPING

## 2023-04-14 ENCOUNTER — Telehealth: Payer: Self-pay

## 2023-04-14 MED ORDER — VALACYCLOVIR HCL 1 G PO TABS: 1000.0000 mg | ORAL_TABLET | Freq: Every day | ORAL | 0 refills | Status: AC

## 2023-04-14 NOTE — Telephone Encounter (Signed)
Per Helaine Chess,  PA-C, "Herpes gladiatorum. Recommend ttmt with Valacyclovir 1 g daily for 5 days." Attempted to reach patient x1. Pt hung up when I introduced myself. Results seen on mychart Rx sent to pharmacy on file.
